# Patient Record
Sex: Female | Born: 1965 | Race: Black or African American | Hispanic: No | Marital: Single | Smoking: Never smoker
Health system: Southern US, Community
[De-identification: ages and names within clinical notes are randomized; demographics above are authoritative.]

## PROBLEM LIST (undated history)

## (undated) DIAGNOSIS — I7300 Raynaud's syndrome without gangrene: Secondary | ICD-10-CM

## (undated) DIAGNOSIS — T148XXA Other injury of unspecified body region, initial encounter: Secondary | ICD-10-CM

## (undated) HISTORY — PX: CYSTECTOMY: SUR359

---

## 2001-02-07 ENCOUNTER — Encounter: Payer: Self-pay | Admitting: Orthopedic Surgery

## 2001-02-07 ENCOUNTER — Ambulatory Visit (HOSPITAL_COMMUNITY): Admission: RE | Admit: 2001-02-07 | Discharge: 2001-02-07 | Payer: Self-pay | Admitting: Orthopedic Surgery

## 2012-06-28 ENCOUNTER — Emergency Department (INDEPENDENT_AMBULATORY_CARE_PROVIDER_SITE_OTHER)
Admission: EM | Admit: 2012-06-28 | Discharge: 2012-06-28 | Disposition: A | Discharge status: Home / Self Care | Payer: Self-pay | Source: Home / Self Care

## 2012-06-28 ENCOUNTER — Encounter (HOSPITAL_COMMUNITY): Payer: Self-pay | Admitting: Emergency Medicine

## 2012-06-28 DIAGNOSIS — J019 Acute sinusitis, unspecified: Secondary | ICD-10-CM

## 2012-06-28 HISTORY — DX: Raynaud's syndrome without gangrene: I73.00

## 2012-06-28 HISTORY — DX: Other injury of unspecified body region, initial encounter: T14.8XXA

## 2012-06-28 MED ORDER — AZITHROMYCIN 250 MG PO TABS: 250.0000 mg | ORAL_TABLET | Freq: Every day | ORAL | Status: AC

## 2012-06-28 MED ORDER — PHENYLEPHRINE-CHLORPHEN-DM 10-4-12.5 MG/5ML PO LIQD: 5.0000 mL | Freq: Four times a day (QID) | ORAL | Status: DC | PRN

## 2012-06-28 NOTE — ED Notes (Addendum)
Pt c/o poss sinus infection x13 days... Having headaches, clear yellow mucous, intermittent fevers (felt warm), and diarrhea... Its worse at night... She denies vomiting.... Also c/o pain on the right side of her head that radiates down to her right leg.

## 2012-06-28 NOTE — ED Provider Notes (Signed)
History     CSN: [id number]  Arrival date & time 06/28/12  1154   None     Chief Complaint  Patient presents with  . Facial Pain    (Consider location/radiation/quality/duration/timing/severity/associated sxs/prior treatment) Patient is a 46 y.o. female presenting with URI.  URI The primary symptoms include headaches. Primary symptoms do not include fever, fatigue, ear pain, sore throat, swollen glands, cough, wheezing, abdominal pain, nausea, vomiting, myalgias, arthralgias or rash. The current episode started more than 1 week ago. The problem has not changed since onset. Symptoms associated with the illness include facial pain, sinus pressure and rhinorrhea. The illness is not associated with chills.    Past Medical History  Diagnosis Date  . Raynaud phenomenon   . Nerve damage     Past Surgical History  Procedure Date  . Cystectomy     No family history on file.  History  Substance Use Topics  . Smoking status: Not on file  . Smokeless tobacco: Not on file  . Alcohol Use:     OB History    Grav Para Term Preterm Abortions TAB SAB Ect Mult Living                  Review of Systems  Constitutional: Negative for fever, chills and fatigue.  HENT: Positive for rhinorrhea and sinus pressure. Negative for ear pain and sore throat.   Respiratory: Negative for cough and wheezing.   Gastrointestinal: Negative for nausea, vomiting and abdominal pain.  Musculoskeletal: Negative for myalgias, back pain and arthralgias.  Skin: Negative.  Negative for rash.  Neurological: Positive for headaches.  Psychiatric/Behavioral: Negative.     Allergies  Penicillins and Sulfur  Home Medications   Current Outpatient Rx  Name Route Sig Dispense Refill  . TYLENOL 8 HOUR PO Oral Take by mouth.    Marland Kitchen BENADRYL ALLERGY PO Oral Take by mouth.    Marland Kitchen ALKA-SELTZER PLS SINUS & COUGH PO Oral Take by mouth.    . AZITHROMYCIN 250 MG PO TABS Oral Take 1 tablet (250 mg total) by mouth  daily. 2 tabs po on day one, then one tablet po once daily on days 2-5. 6 tablet 0  . PHENYLEPHRINE-CHLORPHEN-DM 07-30-11.5 MG/5ML PO LIQD Oral Take 5 mLs by mouth 4 (four) times daily as needed. 150 mL 0    BP 150/109  Pulse 84  Temp 98.5 F (36.9 C) (Oral)  Resp 16  SpO2 97%  LMP 06/28/2012  Physical Exam  Constitutional: She is oriented to person, place, and time. She appears well-developed and well-nourished.  HENT:  Right Ear: External ear normal.  Left Ear: External ear normal.  Mouth/Throat: Oropharynx is clear and moist.  Eyes: Conjunctivae are normal. Pupils are equal, round, and reactive to light. Right eye exhibits no discharge. Left eye exhibits no discharge.  Neck: Normal range of motion. Neck supple.  Cardiovascular: Normal rate and regular rhythm.   Pulmonary/Chest: Effort normal and breath sounds normal.  Musculoskeletal: She exhibits no edema and no tenderness.  Neurological: She is alert and oriented to person, place, and time.  Skin: Skin is warm and dry.    ED Course  Procedures (including critical care time)  Labs Reviewed - No data to display No results found.   1. Sinusitis acute       MDM  Norel CS 1 tsp qid prn,   Tylenol prn, plenty of fluids.  Z pack. Call your PCP for f/u and your neurologist for recurrent neuralgic pain.  Hayden Rasmussen, NP 06/28/12 1357

## 2012-06-29 NOTE — ED Provider Notes (Signed)
Medical screening examination/treatment/procedure(s) were performed by resident physician or non-physician practitioner and as supervising physician I was immediately available for consultation/collaboration.   KINDL,JAMES DOUGLAS MD.    James D Kindl, MD 06/29/12 2135 

## 2012-07-05 ENCOUNTER — Telehealth (HOSPITAL_COMMUNITY): Payer: Self-pay | Admitting: *Deleted

## 2012-07-05 NOTE — ED Notes (Signed)
Pt. came to [facility] and asked about referral to [facility]. I told Adam to tell her I would work on it today and call her back.  I faxed referral form, NP notes, nurse note, and demographic sheet to the new pt. coordinator Diane @ [phone] and confirmation received. I called pt. back and told her I had faxed her information.  I said that Diane at [facility] would call her to schedule an appointment. If she does not hear form her in 2 days to call the office.  Pt. voiced understanding and thanked me. Vassie Moselle 07/05/2012

## 2014-05-28 ENCOUNTER — Emergency Department (HOSPITAL_COMMUNITY)
Admission: EM | Admit: 2014-05-28 | Discharge: 2014-05-28 | Disposition: A | Discharge status: Home / Self Care | Payer: Self-pay | Attending: Emergency Medicine | Admitting: Emergency Medicine

## 2014-05-28 ENCOUNTER — Encounter (HOSPITAL_COMMUNITY): Payer: Self-pay | Admitting: Emergency Medicine

## 2014-05-28 ENCOUNTER — Emergency Department (HOSPITAL_COMMUNITY): Payer: Self-pay

## 2014-05-28 DIAGNOSIS — Z8679 Personal history of other diseases of the circulatory system: Secondary | ICD-10-CM | POA: Insufficient documentation

## 2014-05-28 DIAGNOSIS — Z79899 Other long term (current) drug therapy: Secondary | ICD-10-CM | POA: Insufficient documentation

## 2014-05-28 DIAGNOSIS — Z880 Allergy status to penicillin: Secondary | ICD-10-CM | POA: Insufficient documentation

## 2014-05-28 DIAGNOSIS — R61 Generalized hyperhidrosis: Secondary | ICD-10-CM | POA: Insufficient documentation

## 2014-05-28 DIAGNOSIS — R42 Dizziness and giddiness: Secondary | ICD-10-CM | POA: Insufficient documentation

## 2014-05-28 DIAGNOSIS — Z87828 Personal history of other (healed) physical injury and trauma: Secondary | ICD-10-CM | POA: Insufficient documentation

## 2014-05-28 DIAGNOSIS — E860 Dehydration: Secondary | ICD-10-CM | POA: Insufficient documentation

## 2014-05-28 LAB — CBC WITH DIFFERENTIAL/PLATELET
Basophils Absolute: 0 10*3/uL (ref 0.0–0.1)
Basophils Relative: 0 % (ref 0–1)
Eosinophils Absolute: 0 10*3/uL (ref 0.0–0.7)
Eosinophils Relative: 1 % (ref 0–5)
HCT: 40.4 % (ref 36.0–46.0)
Hemoglobin: 13.1 g/dL (ref 12.0–15.0)
Lymphocytes Relative: 14 % (ref 12–46)
Lymphs Abs: 1.1 10*3/uL (ref 0.7–4.0)
MCH: 26.8 pg (ref 26.0–34.0)
MCHC: 32.4 g/dL (ref 30.0–36.0)
MCV: 82.6 fL (ref 78.0–100.0)
Monocytes Absolute: 0.3 10*3/uL (ref 0.1–1.0)
Monocytes Relative: 4 % (ref 3–12)
Neutro Abs: 6.3 10*3/uL (ref 1.7–7.7)
Neutrophils Relative %: 81 % — ABNORMAL HIGH (ref 43–77)
Platelets: 319 10*3/uL (ref 150–400)
RBC: 4.89 MIL/uL (ref 3.87–5.11)
RDW: 13.4 % (ref 11.5–15.5)
WBC: 7.8 10*3/uL (ref 4.0–10.5)

## 2014-05-28 LAB — COMPREHENSIVE METABOLIC PANEL
ALT: 25 U/L (ref 0–35)
AST: 25 U/L (ref 0–37)
Albumin: 3.5 g/dL (ref 3.5–5.2)
Alkaline Phosphatase: 74 U/L (ref 39–117)
Anion gap: 14 (ref 5–15)
BUN: 10 mg/dL (ref 6–23)
CO2: 20 mEq/L (ref 19–32)
Calcium: 9.2 mg/dL (ref 8.4–10.5)
Chloride: 105 mEq/L (ref 96–112)
Creatinine, Ser: 0.71 mg/dL (ref 0.50–1.10)
GFR calc Af Amer: >90 mL/min (ref >90)
GFR calc non Af Amer: >90 mL/min (ref >90)
Glucose, Bld: 119 mg/dL — ABNORMAL HIGH (ref 70–99)
Potassium: 4.3 mEq/L (ref 3.7–5.3)
Sodium: 139 mEq/L (ref 137–147)
Total Bilirubin: 0.3 mg/dL (ref 0.3–1.2)
Total Protein: 6.9 g/dL (ref 6.0–8.3)

## 2014-05-28 LAB — URINALYSIS, ROUTINE W REFLEX MICROSCOPIC
Bilirubin Urine: NEGATIVE
Glucose, UA: NEGATIVE mg/dL
Hgb urine dipstick: NEGATIVE
Ketones, ur: 15 mg/dL — AB
Leukocytes, UA: NEGATIVE
Nitrite: NEGATIVE
Protein, ur: NEGATIVE mg/dL
Specific Gravity, Urine: 1.014 (ref 1.005–1.030)
Urobilinogen, UA: 0.2 mg/dL (ref 0.0–1.0)
pH: 5.5 (ref 5.0–8.0)

## 2014-05-28 MED ORDER — MECLIZINE HCL 25 MG PO TABS
25.0000 mg | ORAL_TABLET | Freq: Once | ORAL | Status: AC
  Administered 2014-05-28: 25 mg via ORAL
  Filled 2014-05-28: qty 1

## 2014-05-28 MED ORDER — SODIUM CHLORIDE 0.9 % IV BOLUS (SEPSIS)
2000.0000 mL | Freq: Once | INTRAVENOUS | Status: AC
  Administered 2014-05-28: 2000 mL via INTRAVENOUS

## 2014-05-28 MED ORDER — DIAZEPAM 5 MG PO TABS: 5.0000 mg | ORAL_TABLET | Freq: Three times a day (TID) | ORAL | Status: AC | PRN

## 2014-05-28 MED ORDER — SODIUM CHLORIDE 0.9 % IV BOLUS (SEPSIS)
1000.0000 mL | Freq: Once | INTRAVENOUS | Status: AC
  Administered 2014-05-28: 1000 mL via INTRAVENOUS

## 2014-05-28 MED ORDER — PROMETHAZINE HCL 25 MG PO TABS: 25.0000 mg | ORAL_TABLET | Freq: Three times a day (TID) | ORAL | Status: AC | PRN

## 2014-05-28 MED ORDER — ONDANSETRON HCL 4 MG/2ML IJ SOLN
4.0000 mg | Freq: Once | INTRAMUSCULAR | Status: AC
  Administered 2014-05-28: 4 mg via INTRAVENOUS
  Filled 2014-05-28: qty 2

## 2014-05-28 MED ORDER — DIAZEPAM 5 MG PO TABS
5.0000 mg | ORAL_TABLET | Freq: Once | ORAL | Status: AC
  Administered 2014-05-28: 5 mg via ORAL
  Filled 2014-05-28: qty 1

## 2014-05-28 NOTE — ED Notes (Signed)
Patient transported to MRI 

## 2014-05-28 NOTE — Progress Notes (Signed)
[facility],   Provided pt with a list of primary care resources and a [facility] application to help patient establish primary care. Also, left patient with [facility] information.   Contact information provided for any questions or concerns.

## 2014-05-28 NOTE — ED Notes (Signed)
Patient ambulated to bathroom with a walker.  Patient complained of some dizziness after getting back into bed.

## 2014-05-28 NOTE — ED Notes (Signed)
Walked patient 30 ft HR 101 02 98% She felt a little dizzy but nothing like before

## 2014-05-28 NOTE — ED Provider Notes (Signed)
CSN: [id number]     Arrival date & time 05/28/14  1032 History   First MD Initiated Contact with Patient 05/28/14 1115     Chief Complaint  Patient presents with  . Dizziness  . Excessive Sweating     (Consider location/radiation/quality/duration/timing/severity/associated sxs/prior Treatment) HPI Patient presents to the emergency department with dizziness and diaphoresis yesterday.  The patient, states she's had several episodes of vomiting today and the patient, states she was working in the yard, when the symptoms started.  She states, that she's having room spinning, gets worse with position change.  The patient, states, that she did not have any chest pain, shortness of breath, headache, blurred vision, neck pain, weakness dysuria, diarrhea, abdominal pain, rash, fever, altered mental status, or syncope.  The patient, states, that she did not take any medications prior to arrival Past Medical History  Diagnosis Date  . Raynaud phenomenon   . Nerve damage    Past Surgical History  Procedure Laterality Date  . Cystectomy     History reviewed. No pertinent family history. History  Substance Use Topics  . Smoking status: Never Smoker   . Smokeless tobacco: Not on file  . Alcohol Use: No   OB History   Grav Para Term Preterm Abortions TAB SAB Ect Mult Living                 Review of Systems  All other systems negative except as documented in the HPI. All pertinent positives and negatives as reviewed in the HPI.  Allergies  Other; Penicillins; and Sulfur  Home Medications   Prior to Admission medications   Medication Sig Start Date End Date Taking? Authorizing Provider  acetaminophen (TYLENOL) 500 MG tablet Take 500 mg by mouth every 6 (six) hours as needed for moderate pain or headache.   Yes Historical Provider, MD  diphenhydrAMINE (BENADRYL) 25 mg capsule Take 25 mg by mouth at bedtime.   Yes Historical Provider, MD  DM-Phenylephrine-Acetaminophen (ALKA-SELTZER PLS  SINUS & COUGH PO) Take 1 tablet by mouth daily as needed (for congestion).    Yes Historical Provider, MD  Homeopathic Products [facility](ZICAM ALLERGY RELIEF) GEL Place 1 spray into both nostrils daily.   Yes Historical Provider, MD   BP 150/91  Pulse 94  Temp(Src) 98 F (36.7 C) (Oral)  Resp 19  Ht 5' 1.75" (1.568 m)  Wt 178 lb (80.74 kg)  BMI 32.84 kg/m2  SpO2 100% Physical Exam  Nursing note and vitals reviewed. Constitutional: She is oriented to person, place, and time. She appears well-developed and well-nourished. No distress.  HENT:  Head: Normocephalic and atraumatic.  Mouth/Throat: Oropharynx is clear and moist.  Eyes: Pupils are equal, round, and reactive to light.  Neck: Normal range of motion. Neck supple.  Cardiovascular: Normal rate, regular rhythm and normal heart sounds.  Exam reveals no gallop and no friction rub.   No murmur heard. Pulmonary/Chest: Effort normal and breath sounds normal. No respiratory distress.  Musculoskeletal: She exhibits no edema.  Neurological: She is alert and oriented to person, place, and time. She has normal reflexes. She exhibits normal muscle tone. Coordination normal.  Skin: Skin is warm and dry. No erythema.  Psychiatric: She has a normal mood and affect. Her behavior is normal. Judgment and thought content normal.    ED Course  Procedures (including critical care time) Labs Review Labs Reviewed  CBC WITH DIFFERENTIAL - Abnormal; Notable for the following:    Neutrophils Relative % 81 (*)  All other components within normal limits  COMPREHENSIVE METABOLIC PANEL - Abnormal; Notable for the following:    Glucose, Bld 119 (*)    All other components within normal limits  URINALYSIS, ROUTINE W REFLEX MICROSCOPIC - Abnormal; Notable for the following:    Ketones, ur 15 (*)    All other components within normal limits  URINE CULTURE    Imaging Review Mr Brain Wo Contrast  05/28/2014   CLINICAL DATA:  Dizziness with nausea and  diaphoresis. Evaluate for possible CVA.  EXAM: MRI HEAD WITHOUT CONTRAST  TECHNIQUE: Multiplanar, multiecho pulse sequences of the brain and surrounding structures were obtained without intravenous contrast.  COMPARISON:  None.  FINDINGS: No evidence for acute infarction, hemorrhage, mass lesion, hydrocephalus, or extra-axial fluid. Normal for age cerebral volume. Premature subcortical greater than periventricular white matter signal abnormality, not associated with cortical infarction, or visible lacunes, is nonspecific. Consideration should include premature small vessel disease, vasculitis, complicated migraine, chronic infection, atypical demyelinating process, or idiopathic. Correlate clinically.  Flow voids are maintained. There are no areas of chronic hemorrhage.  Pituitary is normal. No pineal lesion. There is abnormal cerebellar tonsillar descent through the foramen magnum into the upper cervical canal of 6 mm, borderline Chiari I malformation. No hydromyelia or upper cervical cord compression.  No evidence for acute or chronic posterior fossa ischemia. Major dural venous sinuses appear patent. Paranasal sinuses are clear. No mastoid fluid. Negative orbits. Extracranial soft tissues demonstrate increased scalp adiposity of uncertain significance. Shotty cervical lymph nodes are incompletely evaluated.  IMPRESSION: Premature subcortical greater than periventricular white matter signal abnormality without evidence for acute cerebral infarction or hemorrhage. The appearance is nonspecific. Recommend clinical correlation.  Borderline Chiari I malformation. 6 mm tonsillar descent without hydromyelia or hydrocephalus.  No evidence for acute posterior circulation cerebral infarction.   Electronically Signed   By: Davonna Belling M.D.   On: 05/28/2014 17:35     The patient did not have any signs of stroke on her MRI.  Patient most likely had dehydration, with peripheral vertigo.  The patient also had some sinus  drainage prior to this as, well.  She's been working outside and therefore I feel that this is caused dehydration.  Patient is advised followup with her primary care Dr. told to return here as needed  Carlyle Dolly, PA-C 05/30/14 2003

## 2014-05-28 NOTE — ED Notes (Signed)
Pt placed into room. Pt changing into gown at this time.

## 2014-05-28 NOTE — Discharge Instructions (Signed)
Return here as needed. Follow up with your doctor. °

## 2014-05-28 NOTE — ED Notes (Signed)
Patient returned from MRI and hooked back to the monitor.

## 2014-05-28 NOTE — ED Notes (Signed)
Pt reports dizziness, and diaphoresis sts symptoms worse with standing, vomiting this morning.

## 2014-05-29 LAB — URINE CULTURE: Colony Count: 35000

## 2014-05-31 NOTE — ED Provider Notes (Signed)
Medical screening examination/treatment/procedure(s) were conducted as a shared visit with non-physician practitioner(s) and myself.  I personally evaluated the patient during the encounter.   EKG Interpretation None      I interviewed and examined the patient. Lungs are CTAB. Cardiac exam wnl. Abdomen soft. Dizziness possibly related to dehydration but it persisted after IVF hydration. Low suspicion for post CVA but will get MRI to rule this out.   Purvis SheffieldForrest Madalee Altmann, MD 05/31/14 1221

## 2016-01-31 IMAGING — MR MR HEAD W/O CM
9 of 11 series · 27 of 48 positions shown · non-contrast
Comparison: None.

CLINICAL DATA: Dizziness with nausea and diaphoresis. Evaluate for
possible CVA.

EXAM:
MRI HEAD WITHOUT CONTRAST
TECHNIQUE: Multiplanar, multiecho pulse sequences of the brain and surrounding
structures were obtained without intravenous contrast.

[Series 3: DWI · axial · 5.0mm · 1.02mm/px · z∈[-41,+89]mm · 3 of 54 slices shown (1 of 4)]
[im 1/54]
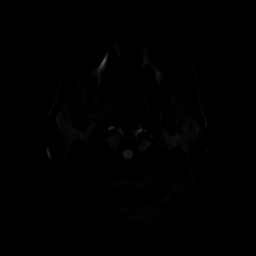
[im 27/54]
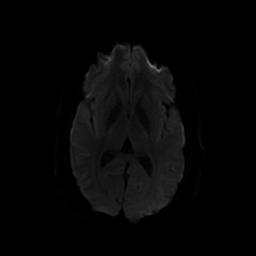
[im 54/54]
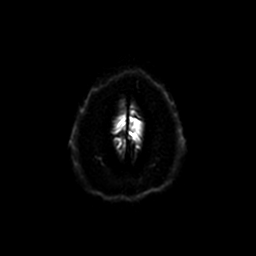

[Series 4: DWI · coronal · 5.0mm · 1.02mm/px · 5 of 66 slices shown (2 of 4)]
[im 1/66]
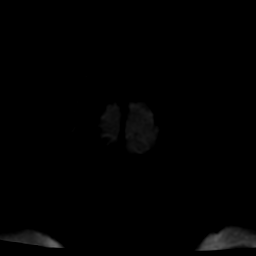
[im 17/66]
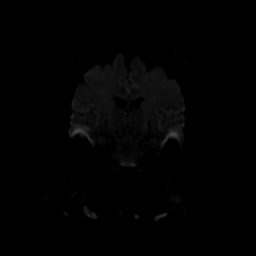
[im 33/66]
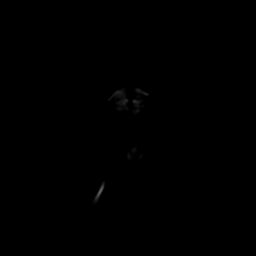
[im 49/66]
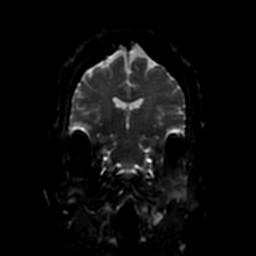
[im 66/66]
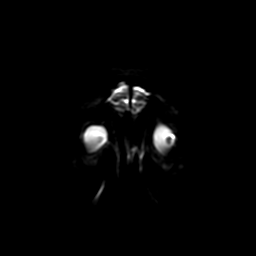

[Series 5: FLAIR · sagittal · 5.0mm · 0.47mm/px · 2 of 21 slices shown (1 of 2)]
[im 1/21]
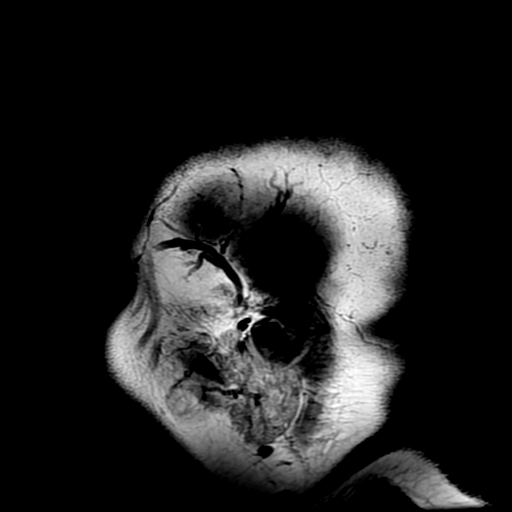
[im 21/21]
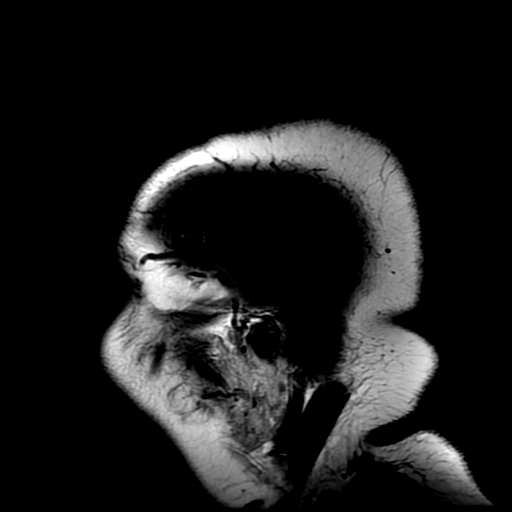

[Series 6: T2 · axial · 5.0mm · 0.43mm/px · z∈[-40,+92]mm · 2 of 23 slices shown (1 of 2)]
[im 1/23]
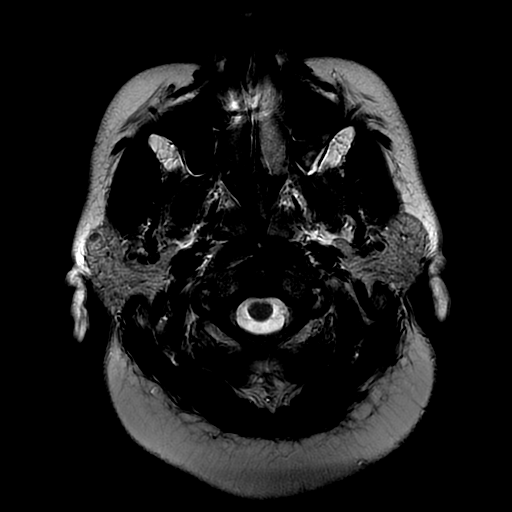
[im 23/23]
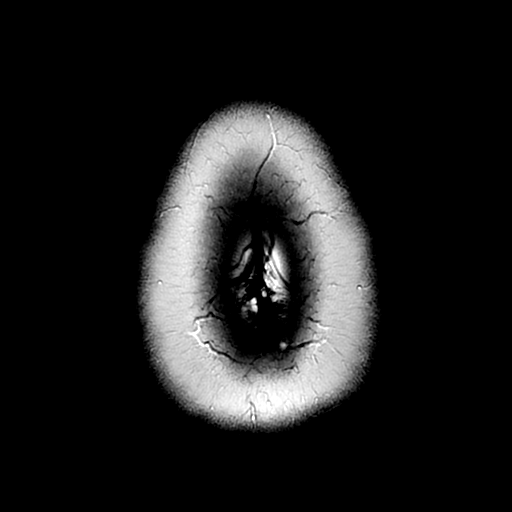

[Series 7: FLAIR · axial · 5.0mm · 0.43mm/px · z∈[-40,+92]mm · 2 of 23 slices shown (2 of 2)]
[im 1/23]
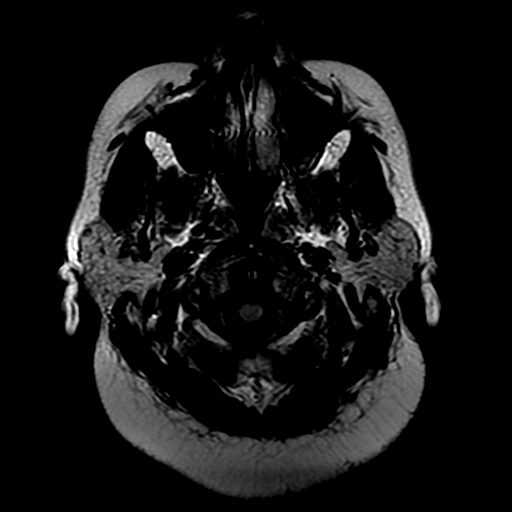
[im 23/23]
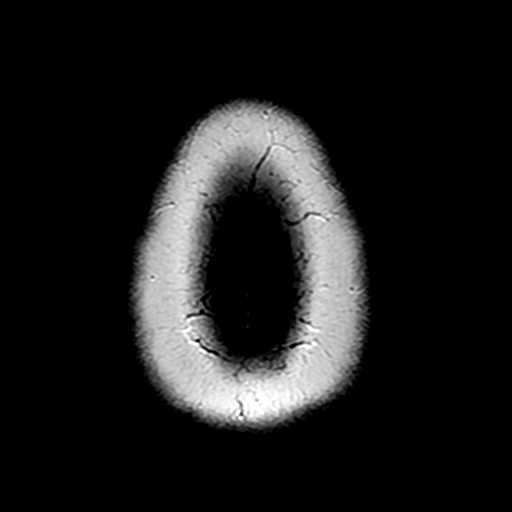

[Series 8: (person_name) · axial · 3.6mm · 0.47mm/px · z∈[-44,+73]mm · 7 of 160 slices shown]
[im 1/160]
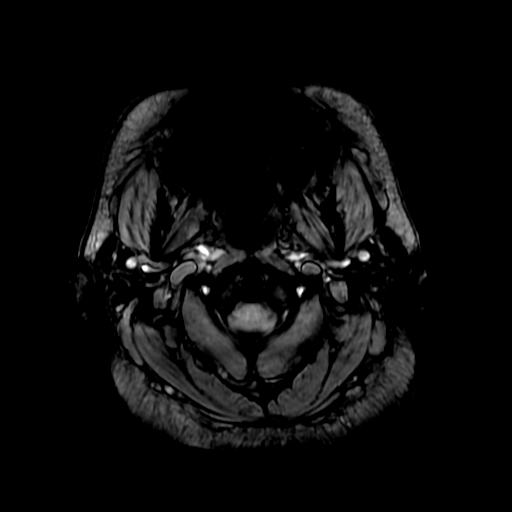
[im 29/160]
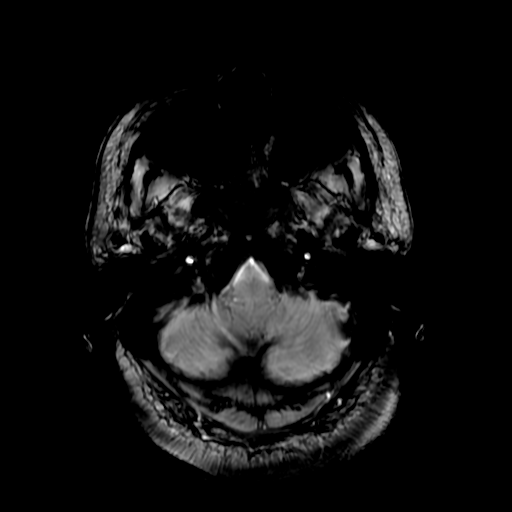
[im 44/160]
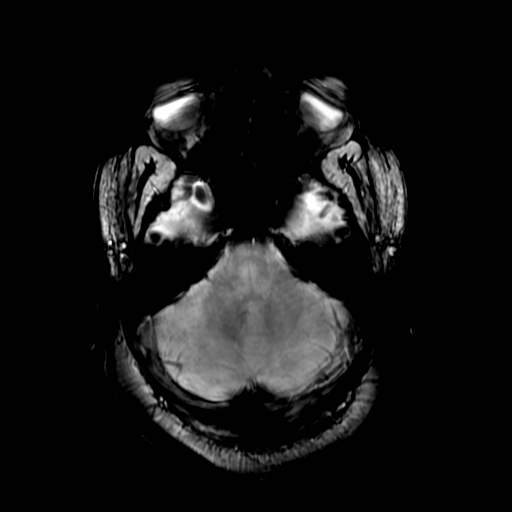
[im 73/160]
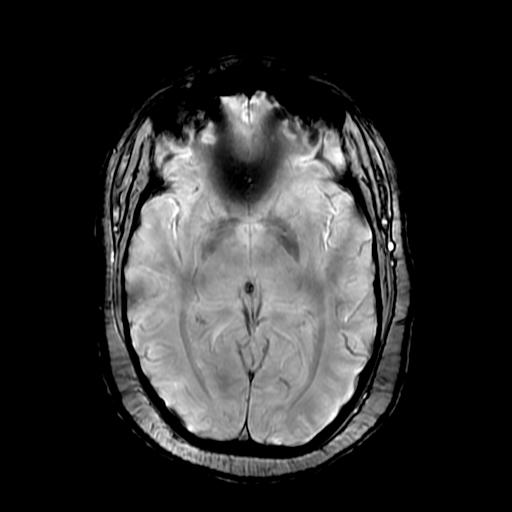
[im 87/160]
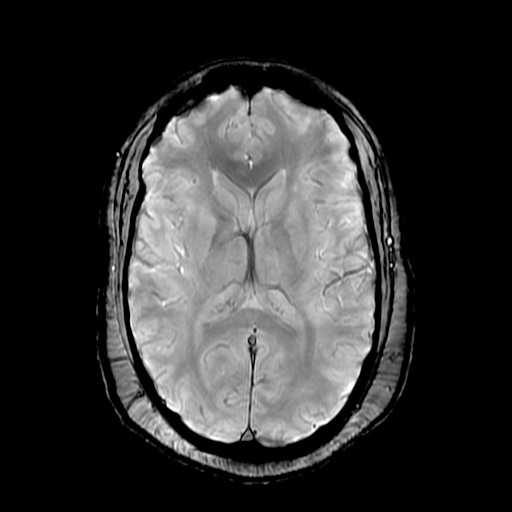
[im 116/160]
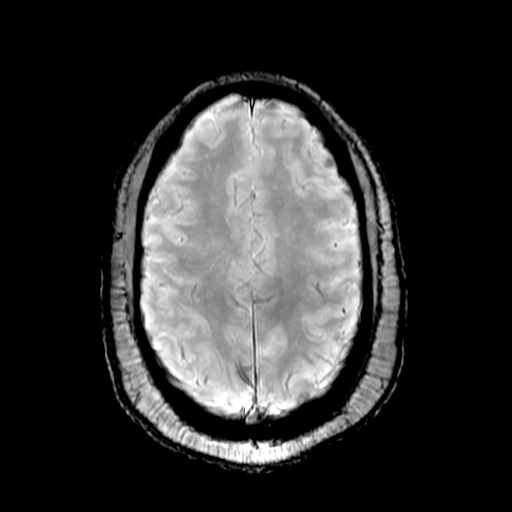
[im 131/160]
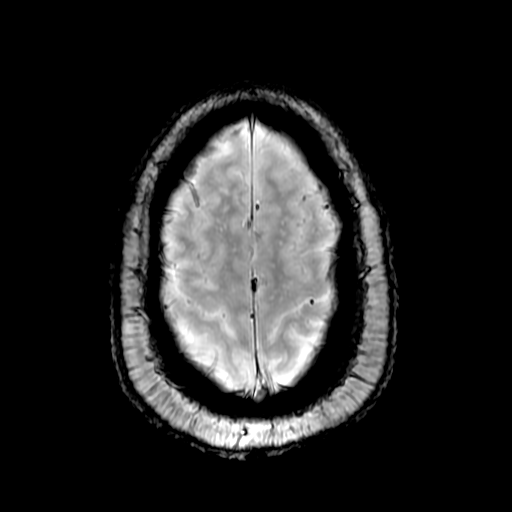

[Series 10: T2 · coronal · 5.0mm · 0.43mm/px · 2 of 28 slices shown (2 of 2)]
[im 1/28]
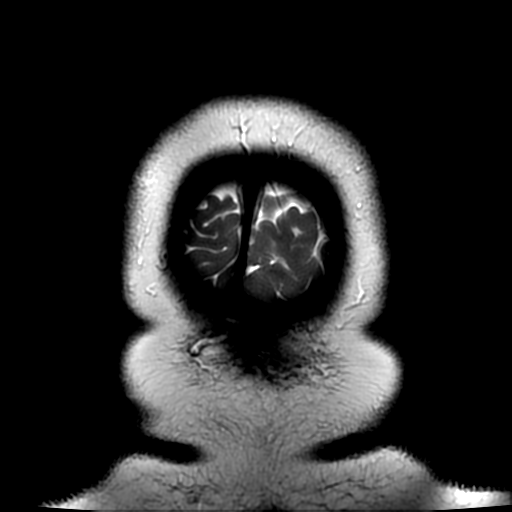
[im 28/28]
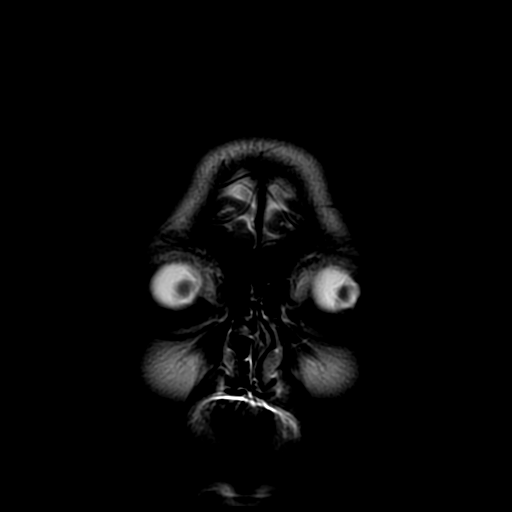

[Series 300: DWI · axial · 5.0mm · 1.02mm/px · z∈[-41,+89]mm · 2 of 27 slices shown (3 of 4)]
[im 1/27]
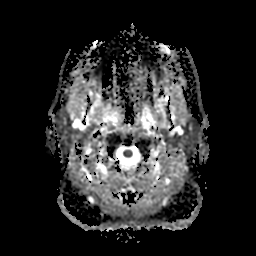
[im 27/27]
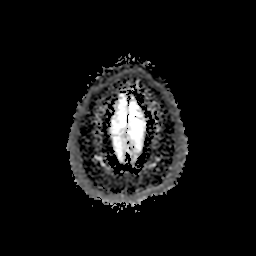

[Series 400: DWI · coronal · 5.0mm · 1.02mm/px · 2 of 33 slices shown (4 of 4)]
[im 1/33]
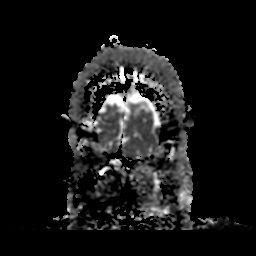
[im 33/33]
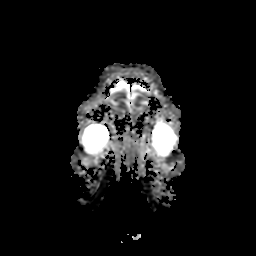

[27 of 48 positions shown; findings below may reference images not displayed]

FINDINGS: No evidence for acute infarction, hemorrhage, mass lesion,
hydrocephalus, or extra-axial fluid. Normal for age cerebral volume.
Premature subcortical greater than periventricular white matter
signal abnormality, not associated with cortical infarction, or
visible lacunes, is nonspecific. Consideration should include
premature small vessel disease, vasculitis, complicated migraine,
chronic infection, atypical demyelinating process, or idiopathic.
Correlate clinically.

Flow voids are maintained. There are no areas of chronic hemorrhage.

Pituitary is normal. No pineal lesion. There is abnormal cerebellar
tonsillar descent through the foramen magnum into the upper cervical
canal of 6 mm, borderline Chiari I malformation. No hydromyelia or
upper cervical cord compression.

No evidence for acute or chronic posterior fossa ischemia. Major
dural venous sinuses appear patent. Paranasal sinuses are clear. No
mastoid fluid. Negative orbits. Extracranial soft tissues
demonstrate increased scalp adiposity of uncertain significance.
Shotty cervical lymph nodes are incompletely evaluated.
IMPRESSION: Premature subcortical greater than periventricular white matter
signal abnormality without evidence for acute cerebral infarction or
hemorrhage. The appearance is nonspecific. Recommend clinical
correlation.

Borderline Chiari I malformation. 6 mm tonsillar descent without
hydromyelia or hydrocephalus.

No evidence for acute posterior circulation cerebral infarction.

## 2019-04-18 ENCOUNTER — Other Ambulatory Visit: Payer: Self-pay | Admitting: Internal Medicine

## 2019-04-18 DIAGNOSIS — Z20822 Contact with and (suspected) exposure to covid-19: Secondary | ICD-10-CM

## 2019-04-20 LAB — NOVEL CORONAVIRUS, NAA: SARS-CoV-2, NAA: NOT DETECTED

## 2019-12-17 ENCOUNTER — Ambulatory Visit: Payer: Self-pay | Attending: Internal Medicine

## 2019-12-17 DIAGNOSIS — Z23 Encounter for immunization: Secondary | ICD-10-CM | POA: Insufficient documentation

## 2019-12-17 NOTE — Progress Notes (Signed)
   Covid-19 Vaccination Clinic  Name:  Natalie Mata    MRN: [id number] DOB: 1966-02-28  12/17/2019  [name] was observed post Covid-19 immunization for 15 minutes without incidence. She was provided with Vaccine Information Sheet and instruction to access the V-Safe system.   [name] was instructed to call 911 with any severe reactions post vaccine: Marland Kitchen Difficulty breathing  . Swelling of your face and throat  . A fast heartbeat  . A bad rash all over your body  . Dizziness and weakness    Immunizations Administered    Name Date Dose VIS Date Route   Pfizer COVID-19 Vaccine 12/17/2019  9:05 AM 0.3 mL 10/06/2019 Intramuscular   Manufacturer: [employer], [employer]   Lot: [id number]   NDC: [address]-1

## 2020-01-10 ENCOUNTER — Ambulatory Visit: Payer: Self-pay | Attending: Internal Medicine

## 2020-01-10 DIAGNOSIS — Z23 Encounter for immunization: Secondary | ICD-10-CM

## 2020-01-10 NOTE — Progress Notes (Signed)
   Covid-19 Vaccination Clinic  Name:  Natalie Mata    MRN: [id number] DOB: 06-May-1966  01/10/2020  [name] was observed post Covid-19 immunization for 15 minutes without incident. She was provided with Vaccine Information Sheet and instruction to access the V-Safe system.   [name] was instructed to call 911 with any severe reactions post vaccine: Marland Kitchen Difficulty breathing  . Swelling of face and throat  . A fast heartbeat  . A bad rash all over body  . Dizziness and weakness   Immunizations Administered    Name Date Dose VIS Date Route   Pfizer COVID-19 Vaccine 01/10/2020  8:43 AM 0.3 mL 10/06/2019 Intramuscular   Manufacturer: [employer], [employer]   Lot: [id number]   NDC: [address]-9

## 2020-08-23 ENCOUNTER — Ambulatory Visit: Payer: Self-pay | Attending: Internal Medicine

## 2020-08-23 DIAGNOSIS — Z23 Encounter for immunization: Secondary | ICD-10-CM

## 2020-08-23 NOTE — Progress Notes (Signed)
   Covid-19 Vaccination Clinic  Name:  Natalie Mata    MRN: [id number] DOB: 06-25-1966  08/23/2020  Natalie Mata was observed post Covid-19 immunization for 15 minutes without incident. She was provided with Vaccine Information Sheet and instruction to access the V-Safe system.   Natalie Mata was instructed to call 911 with any severe reactions post vaccine: Marland Kitchen Difficulty breathing  . Swelling of face and throat  . A fast heartbeat  . A bad rash all over body  . Dizziness and weakness

## 2023-03-15 NOTE — Progress Notes (Signed)
Pt needs PCP. No SDOH needs at this time.

## 2023-04-05 ENCOUNTER — Encounter: Payer: Self-pay | Admitting: *Deleted

## 2023-04-05 NOTE — Progress Notes (Signed)
 Pt attended screening event on 03/15/23, where screening result was 138/81. At the event, Pt shared needing PCP and no SDOH insecurities was indicated. Per chart review, Pt has upcoming appointment with [place]  Orvan Blanch, PA-C on 06/14/2023 and was seen by her on 12/04/2022 for hypertension follow up. Pt was contacted for PCP follow up. Pt was driving at that time of call and will contact Pt this week again. Pt was contacted by phone on 04/08/23 for PCP follow up. During a call, Pt informed caller that she might have mistakenly shared her needing PCP because she has a primary care doctor at [facility]. Per Pt, PCP name is [place] at [facility]. Per chart review, [place]  Orvan Blanch, PA-C at [facility] is listed as PCP since 01/03/19. No additional health equity team support indicated at this time.

## 2023-07-24 ENCOUNTER — Other Ambulatory Visit: Payer: Self-pay | Admitting: Family Medicine

## 2023-07-24 DIAGNOSIS — Z1231 Encounter for screening mammogram for malignant neoplasm of breast: Secondary | ICD-10-CM

## 2024-05-17 ENCOUNTER — Other Ambulatory Visit: Payer: Self-pay

## 2024-05-17 ENCOUNTER — Encounter (HOSPITAL_BASED_OUTPATIENT_CLINIC_OR_DEPARTMENT_OTHER): Payer: Self-pay | Admitting: Emergency Medicine

## 2024-05-17 ENCOUNTER — Emergency Department (HOSPITAL_BASED_OUTPATIENT_CLINIC_OR_DEPARTMENT_OTHER)
Admission: EM | Admit: 2024-05-17 | Discharge: 2024-05-17 | Disposition: A | Discharge status: Home / Self Care | Attending: Emergency Medicine | Admitting: Emergency Medicine

## 2024-05-17 DIAGNOSIS — T7840XD Allergy, unspecified, subsequent encounter: Secondary | ICD-10-CM

## 2024-05-17 DIAGNOSIS — T7840XA Allergy, unspecified, initial encounter: Secondary | ICD-10-CM | POA: Diagnosis present

## 2024-05-17 NOTE — Discharge Instructions (Signed)
 You have been seen and discharged from the emergency department.  Continue taking Benadryl and steroid as prescribed.  Stay well-hydrated.  Stop the Pepcid.  Follow-up with your primary provider for further evaluation and further care. Take home medications as prescribed. If you have any worsening symptoms or further concerns for your health please return to an emergency department for further evaluation.

## 2024-05-17 NOTE — Telephone Encounter (Signed)
 Pt states she went to sleep woke up with a headache and her throat was feeling tight. Pt states that she took 2 benadryl. Pt states she thinks she is allergic to Famotidine. Pt was advised to go to ED. Pt agreed.

## 2024-05-17 NOTE — ED Provider Notes (Signed)
 [place] EMERGENCY DEPARTMENT AT [facility] Provider Note   CSN: [id number] Arrival date & time: 05/17/24  1657     Patient presents with: Allergic Reaction   Natalie Mata is a 58 y.o. female.   HPI   58 year old female presents emergency department with concern for allergy to Pepcid.  Yesterday patient had an allergic reaction after eating Cheetos.  Was treated with steroids, Benadryl and Pepcid.  Was sent a prescription to continue all 3.  She is been tolerating the Benadryl and steroids without issue however after a dose of Pepcid she got a mild headache as well as tightness in her throat.  She took an extra dose of the Benadryl and steroids and this improved which she was instructed to come in for reevaluation.  She denies any vomiting, body rash, difficulty swallowing/speaking.  Prior to Admission medications   Medication Sig Start Date End Date Taking? Authorizing Provider  acetaminophen (TYLENOL) 500 MG tablet Take 500 mg by mouth every 6 (six) hours as needed for moderate pain or headache.    [provider]  diazepam  (VALIUM ) 5 MG tablet Take 1 tablet (5 mg total) by mouth every 8 (eight) hours as needed (vertigo). 05/28/14   Lawyer, Lonni, PA-C  diphenhydrAMINE (BENADRYL) 25 mg capsule Take 25 mg by mouth at bedtime.    [provider]  DM-Phenylephrine -Acetaminophen (ALKA-SELTZER PLS SINUS & COUGH PO) Take 1 tablet by mouth daily as needed (for congestion).     [provider]  Homeopathic Products [facility] ALLERGY RELIEF) GEL Place 1 spray into both nostrils daily.    [provider]  promethazine  (PHENERGAN ) 25 MG tablet Take 1 tablet (25 mg total) by mouth every 8 (eight) hours as needed for nausea or vomiting. 05/28/14   Lawyer, Lonni, PA-C    Allergies: Elemental sulfur, Other, Penicillins, and Meloxicam    Review of Systems  Constitutional:  Negative for fever.  HENT:  Negative for drooling, facial swelling and  trouble swallowing.   Respiratory:  Negative for shortness of breath.   Cardiovascular:  Negative for chest pain.  Gastrointestinal:  Negative for abdominal pain, diarrhea and vomiting.  Skin:  Negative for rash.  Neurological:  Negative for headaches.    Updated Vital Signs BP (!) 148/97 (BP Location: Right Arm)   Pulse 85   Temp 97.8 F (36.6 C) (Oral)   Resp 15   Ht 5' 3.75 (1.619 m)   Wt 97.1 kg   SpO2 98%   BMI 37.02 kg/m   Physical Exam Vitals and nursing note reviewed.  Constitutional:      General: She is not in acute distress.    Appearance: Normal appearance.  HENT:     Head: Normocephalic.     Mouth/Throat:     Mouth: Mucous membranes are dry.     Pharynx: Oropharynx is clear. No posterior oropharyngeal erythema.  Cardiovascular:     Rate and Rhythm: Normal rate.  Pulmonary:     Effort: Pulmonary effort is normal. No respiratory distress.  Abdominal:     Palpations: Abdomen is soft.     Tenderness: There is no abdominal tenderness.  Skin:    General: Skin is warm.     Findings: No rash.  Neurological:     Mental Status: She is alert and oriented to person, place, and time. Mental status is at baseline.  Psychiatric:        Mood and Affect: Mood normal.     (all labs ordered are  listed, but only abnormal results are displayed) Labs Reviewed - No data to display  EKG: None  Radiology: No results found.   Procedures   Medications Ordered in the ED - No data to display                                  Medical Decision Making  58 year old female presents emergency department concern for allergy to Pepcid.  She developed a headache and throat tightness after 1 dose.  Is currently on Benadryl and steroids for her previous allergic reaction after eating Cheetos.  Vitals are normal stable on arrival.  She is well-appearing, no findings of acute allergic reaction on exam.  Discussed with the patient possible side effects of Pepcid and to  discontinue this medication.  But instructed her to continue with the Benadryl and steroids.  EpiPen has already been sent for the patient she will be referred outpatient for further evaluation and treatment.  No signs of anaphylaxis/angioedema.  Patient at this time appears safe and stable for discharge and close outpatient follow up. Discharge plan and strict return to ED precautions discussed, patient verbalizes understanding and agreement.     Final diagnoses:  Allergic reaction, subsequent encounter    ED Discharge Orders     None          Bari Roxie HERO, DO 05/17/24 1940

## 2024-05-17 NOTE — ED Triage Notes (Signed)
 Yesterday had some simply cheetos and had allergic reaction, Got steroid and pepcid yesterday  Today took pepcid and feels like having a reaction to pepcid, headache and throat tight   Took benadryl with some relief

## 2024-05-27 NOTE — Progress Notes (Unsigned)
 Pt. Attended the [facility] event on 05/27/2024. Pt BP was 131/90, second reading 120/88. At the event patient stated she takes blood pressure medication. Pt indicated food SDOH insecurities community resource was given at the event. At the event pt was instructed to contact pcp about B/P and was recommended to lower pressure by clinician.

## 2024-08-29 NOTE — Progress Notes (Signed)
 The patient attended a screening event on 07/10/2024 where her BP screening results was 111/86. At the event the patient noted she has [employer]m [place] and does not smoke. Patient did not have any SDOH insecurities. Pt list pcp as Dr. [place]  Almarie Due at [facility].   Per chart review pt has a pcp and the last office visit was on 08/23/2024 for annual exam. The pt BP was 138/85 on 08/23/2024. According to chart pt is currently on hydroCHLOROthiazide/losartan to manage BP. Chart review also indicates a future appt with pcp on 12/22/2024 and 08/24/2025. No additional Health [facility] team support indicated at this time.
